# Patient Record
Sex: Male | Born: 1995 | Race: Black or African American | Hispanic: No | Marital: Single | Smoking: Current some day smoker
Health system: Southern US, Community
[De-identification: ages and names within clinical notes are randomized; demographics above are authoritative.]

## PROBLEM LIST (undated history)

## (undated) DIAGNOSIS — J45909 Unspecified asthma, uncomplicated: Secondary | ICD-10-CM

---

## 2020-08-19 ENCOUNTER — Emergency Department (HOSPITAL_COMMUNITY)
Admission: EM | Admit: 2020-08-19 | Discharge: 2020-08-19 | Disposition: A | Discharge status: Home / Self Care | Payer: Self-pay | Attending: Emergency Medicine | Admitting: Emergency Medicine

## 2020-08-19 ENCOUNTER — Encounter (HOSPITAL_COMMUNITY): Payer: Self-pay | Admitting: *Deleted

## 2020-08-19 ENCOUNTER — Other Ambulatory Visit: Payer: Self-pay

## 2020-08-19 ENCOUNTER — Emergency Department (HOSPITAL_COMMUNITY): Payer: Self-pay

## 2020-08-19 DIAGNOSIS — Y9389 Activity, other specified: Secondary | ICD-10-CM | POA: Insufficient documentation

## 2020-08-19 DIAGNOSIS — S00512A Abrasion of oral cavity, initial encounter: Secondary | ICD-10-CM | POA: Insufficient documentation

## 2020-08-19 DIAGNOSIS — Y998 Other external cause status: Secondary | ICD-10-CM | POA: Insufficient documentation

## 2020-08-19 DIAGNOSIS — F17200 Nicotine dependence, unspecified, uncomplicated: Secondary | ICD-10-CM | POA: Insufficient documentation

## 2020-08-19 DIAGNOSIS — Y9289 Other specified places as the place of occurrence of the external cause: Secondary | ICD-10-CM | POA: Insufficient documentation

## 2020-08-19 MED ORDER — CHLORHEXIDINE GLUCONATE 0.12 % MT SOLN: 15.0000 mL | Freq: Two times a day (BID) | OROMUCOSAL | 0 refills | Status: AC

## 2020-08-19 NOTE — ED Notes (Signed)
Pt discharge paperwork reviewed with the patient. The patient verbalized understanding. Pt discharged.
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
## 2020-08-19 NOTE — Discharge Instructions (Addendum)
Please return to the ER if your symptoms worsen. Use the mouth wash to disinfect your wound as needed.

## 2020-08-19 NOTE — ED Notes (Signed)
Pt refusing x rays. Back to triage.

## 2020-08-19 NOTE — ED Notes (Signed)
Pt states his right shoulder and left hand are hurting
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
## 2020-08-19 NOTE — ED Notes (Signed)
This RN received call from radiology stating that pt now is complaining of L shoulder and R knee pain once he has arrived to the radiology department, not the R shoulder and L hand as stated by the patient to the triage staff only a few minutes prior.

## 2020-08-20 ENCOUNTER — Emergency Department (HOSPITAL_COMMUNITY): Payer: Self-pay

## 2020-08-20 ENCOUNTER — Other Ambulatory Visit (HOSPITAL_COMMUNITY): Payer: Self-pay | Admitting: Radiology

## 2020-08-20 ENCOUNTER — Other Ambulatory Visit: Payer: Self-pay

## 2020-08-20 ENCOUNTER — Emergency Department (HOSPITAL_COMMUNITY)
Admission: EM | Admit: 2020-08-20 | Discharge: 2020-08-20 | Disposition: A | Discharge status: Home / Self Care | Payer: Self-pay | Attending: Emergency Medicine | Admitting: Emergency Medicine

## 2020-08-20 DIAGNOSIS — Y998 Other external cause status: Secondary | ICD-10-CM | POA: Insufficient documentation

## 2020-08-20 DIAGNOSIS — S01111A Laceration without foreign body of right eyelid and periocular area, initial encounter: Secondary | ICD-10-CM | POA: Insufficient documentation

## 2020-08-20 DIAGNOSIS — Y92410 Unspecified street and highway as the place of occurrence of the external cause: Secondary | ICD-10-CM | POA: Insufficient documentation

## 2020-08-20 DIAGNOSIS — Y939 Activity, unspecified: Secondary | ICD-10-CM | POA: Insufficient documentation

## 2020-08-20 DIAGNOSIS — T1490XA Injury, unspecified, initial encounter: Secondary | ICD-10-CM

## 2020-08-20 DIAGNOSIS — T07XXXA Unspecified multiple injuries, initial encounter: Secondary | ICD-10-CM

## 2020-08-20 DIAGNOSIS — S80212A Abrasion, left knee, initial encounter: Secondary | ICD-10-CM | POA: Insufficient documentation

## 2020-08-20 DIAGNOSIS — S60811A Abrasion of right wrist, initial encounter: Secondary | ICD-10-CM | POA: Insufficient documentation

## 2020-08-20 LAB — PROTIME-INR
INR: 1.1 (ref 0.8–1.2)
Prothrombin Time: 13.9 seconds (ref 11.4–15.2)

## 2020-08-20 LAB — URINALYSIS, ROUTINE W REFLEX MICROSCOPIC
Bacteria, UA: NONE SEEN
Bilirubin Urine: NEGATIVE
Glucose, UA: NEGATIVE mg/dL
Hgb urine dipstick: NEGATIVE
Ketones, ur: NEGATIVE mg/dL
Nitrite: NEGATIVE
Protein, ur: NEGATIVE mg/dL
Specific Gravity, Urine: 1.02 (ref 1.005–1.030)
pH: 5 (ref 5.0–8.0)

## 2020-08-20 LAB — CBC
HCT: 39.1 % (ref 39.0–52.0)
Hemoglobin: 12.6 g/dL — ABNORMAL LOW (ref 13.0–17.0)
MCH: 30.2 pg (ref 26.0–34.0)
MCHC: 32.2 g/dL (ref 30.0–36.0)
MCV: 93.8 fL (ref 80.0–100.0)
Platelets: 231 10*3/uL (ref 150–400)
RBC: 4.17 MIL/uL — ABNORMAL LOW (ref 4.22–5.81)
RDW: 13.8 % (ref 11.5–15.5)
WBC: 9.4 10*3/uL (ref 4.0–10.5)
nRBC: 0 % (ref 0.0–0.2)

## 2020-08-20 LAB — I-STAT CHEM 8, ED
BUN: 9 mg/dL (ref 6–20)
Calcium, Ion: 1.19 mmol/L (ref 1.15–1.40)
Chloride: 101 mmol/L (ref 98–111)
Creatinine, Ser: 0.8 mg/dL (ref 0.61–1.24)
Glucose, Bld: 117 mg/dL — ABNORMAL HIGH (ref 70–99)
HCT: 39 % (ref 39.0–52.0)
Hemoglobin: 13.3 g/dL (ref 13.0–17.0)
Potassium: 3.1 mmol/L — ABNORMAL LOW (ref 3.5–5.1)
Sodium: 139 mmol/L (ref 135–145)
TCO2: 24 mmol/L (ref 22–32)

## 2020-08-20 LAB — COMPREHENSIVE METABOLIC PANEL
ALT: 73 U/L — ABNORMAL HIGH (ref 0–44)
AST: 51 U/L — ABNORMAL HIGH (ref 15–41)
Albumin: 3.6 g/dL (ref 3.5–5.0)
Alkaline Phosphatase: 85 U/L (ref 38–126)
Anion gap: 12 (ref 5–15)
BUN: 8 mg/dL (ref 6–20)
CO2: 23 mmol/L (ref 22–32)
Calcium: 9 mg/dL (ref 8.9–10.3)
Chloride: 102 mmol/L (ref 98–111)
Creatinine, Ser: 0.96 mg/dL (ref 0.61–1.24)
GFR calc Af Amer: >60 mL/min (ref >60)
GFR calc non Af Amer: >60 mL/min (ref >60)
Glucose, Bld: 123 mg/dL — ABNORMAL HIGH (ref 70–99)
Potassium: 3 mmol/L — ABNORMAL LOW (ref 3.5–5.1)
Sodium: 137 mmol/L (ref 135–145)
Total Bilirubin: 0.6 mg/dL (ref 0.3–1.2)
Total Protein: 6.9 g/dL (ref 6.5–8.1)

## 2020-08-20 LAB — RAPID URINE DRUG SCREEN, HOSP PERFORMED
Amphetamines: NOT DETECTED
Barbiturates: NOT DETECTED
Benzodiazepines: POSITIVE — AB
Cocaine: NOT DETECTED
Opiates: POSITIVE — AB
Tetrahydrocannabinol: POSITIVE — AB

## 2020-08-20 LAB — SAMPLE TO BLOOD BANK

## 2020-08-20 LAB — LACTIC ACID, PLASMA: Lactic Acid, Venous: 2.2 mmol/L (ref 0.5–1.9)

## 2020-08-20 LAB — ETHANOL: Alcohol, Ethyl (B): <10 mg/dL (ref <10)

## 2020-08-20 MED ORDER — SODIUM CHLORIDE 0.9 % IV BOLUS
500.0000 mL | Freq: Once | INTRAVENOUS | Status: AC
  Administered 2020-08-20: 500 mL via INTRAVENOUS

## 2020-08-20 MED ORDER — POTASSIUM CHLORIDE CRYS ER 20 MEQ PO TBCR
60.0000 meq | EXTENDED_RELEASE_TABLET | Freq: Once | ORAL | Status: AC
  Administered 2020-08-20: 60 meq via ORAL
  Filled 2020-08-20: qty 3

## 2020-08-20 MED ORDER — SODIUM CHLORIDE 0.9 % IV BOLUS
1000.0000 mL | Freq: Once | INTRAVENOUS | Status: AC
  Administered 2020-08-20: 1000 mL via INTRAVENOUS

## 2020-08-20 MED ORDER — IOHEXOL 300 MG/ML  SOLN
100.0000 mL | Freq: Once | INTRAMUSCULAR | Status: AC | PRN
  Administered 2020-08-20: 100 mL via INTRAVENOUS

## 2020-08-20 MED ORDER — LIDOCAINE-EPINEPHRINE 1 %-1:100000 IJ SOLN
10.0000 mL | Freq: Once | INTRAMUSCULAR | Status: DC
  Filled 2020-08-20: qty 1

## 2020-08-20 NOTE — Discharge Instructions (Addendum)
Please return to the emergency department with any new or worsening symptoms.
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
## 2020-08-22 ENCOUNTER — Encounter (HOSPITAL_COMMUNITY): Payer: Self-pay | Admitting: *Deleted

## 2020-08-22 ENCOUNTER — Emergency Department (HOSPITAL_COMMUNITY)
Admission: EM | Admit: 2020-08-22 | Discharge: 2020-08-22 | Disposition: A | Discharge status: Home / Self Care | Attending: Emergency Medicine | Admitting: Emergency Medicine

## 2020-08-22 ENCOUNTER — Other Ambulatory Visit: Payer: Self-pay

## 2020-08-22 ENCOUNTER — Emergency Department (HOSPITAL_COMMUNITY)

## 2020-08-22 DIAGNOSIS — S63259A Unspecified dislocation of unspecified finger, initial encounter: Secondary | ICD-10-CM

## 2020-08-22 DIAGNOSIS — Y92003 Bedroom of unspecified non-institutional (private) residence as the place of occurrence of the external cause: Secondary | ICD-10-CM | POA: Insufficient documentation

## 2020-08-22 DIAGNOSIS — S6991XA Unspecified injury of right wrist, hand and finger(s), initial encounter: Secondary | ICD-10-CM | POA: Diagnosis present

## 2020-08-22 DIAGNOSIS — F17200 Nicotine dependence, unspecified, uncomplicated: Secondary | ICD-10-CM | POA: Insufficient documentation

## 2020-08-22 DIAGNOSIS — J45909 Unspecified asthma, uncomplicated: Secondary | ICD-10-CM | POA: Insufficient documentation

## 2020-08-22 DIAGNOSIS — S63286A Dislocation of proximal interphalangeal joint of right little finger, initial encounter: Secondary | ICD-10-CM | POA: Diagnosis not present

## 2020-08-22 DIAGNOSIS — W06XXXA Fall from bed, initial encounter: Secondary | ICD-10-CM | POA: Insufficient documentation

## 2020-08-22 DIAGNOSIS — W19XXXA Unspecified fall, initial encounter: Secondary | ICD-10-CM

## 2020-08-22 HISTORY — DX: Unspecified asthma, uncomplicated: J45.909
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
## 2020-08-22 NOTE — Discharge Instructions (Addendum)
Keep fingers buddy taped for the next 5-7 days  Ibuprofen or acetaminophen for pain as needed  After 5-7 days remove buddy tape, start moving finger slowly. Expect some pain, will improve with time  Follow up with general doctor if movement and pain do not improve
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
## 2022-03-14 DEATH — deceased
